# Patient Record
Sex: Male | Born: 1950
Health system: Southern US, Community
[De-identification: ages and names within clinical notes are randomized; demographics above are authoritative.]

## PROBLEM LIST (undated history)

## (undated) DIAGNOSIS — I10 Essential (primary) hypertension: Secondary | ICD-10-CM

## (undated) DIAGNOSIS — L719 Rosacea, unspecified: Secondary | ICD-10-CM

## (undated) HISTORY — DX: Rosacea, unspecified: L71.9

## (undated) HISTORY — DX: Essential (primary) hypertension: I10

---

## 2003-08-20 ENCOUNTER — Ambulatory Visit (HOSPITAL_COMMUNITY): Admission: RE | Admit: 2003-08-20 | Discharge: 2003-08-20 | Payer: Self-pay | Admitting: Gastroenterology

## 2005-03-05 ENCOUNTER — Emergency Department (HOSPITAL_COMMUNITY): Admission: EM | Admit: 2005-03-05 | Discharge: 2005-03-05 | Payer: Self-pay | Admitting: *Deleted
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
## 2018-07-26 DIAGNOSIS — Z85828 Personal history of other malignant neoplasm of skin: Secondary | ICD-10-CM | POA: Diagnosis not present

## 2018-07-26 DIAGNOSIS — L570 Actinic keratosis: Secondary | ICD-10-CM | POA: Diagnosis not present

## 2018-07-26 DIAGNOSIS — Z08 Encounter for follow-up examination after completed treatment for malignant neoplasm: Secondary | ICD-10-CM | POA: Diagnosis not present

## 2018-07-26 DIAGNOSIS — C44311 Basal cell carcinoma of skin of nose: Secondary | ICD-10-CM | POA: Diagnosis not present

## 2018-07-26 DIAGNOSIS — X32XXXD Exposure to sunlight, subsequent encounter: Secondary | ICD-10-CM | POA: Diagnosis not present

## 2018-08-29 DIAGNOSIS — E782 Mixed hyperlipidemia: Secondary | ICD-10-CM | POA: Diagnosis not present

## 2018-08-29 DIAGNOSIS — Z0000 Encounter for general adult medical examination without abnormal findings: Secondary | ICD-10-CM | POA: Diagnosis not present

## 2018-08-29 DIAGNOSIS — I10 Essential (primary) hypertension: Secondary | ICD-10-CM | POA: Diagnosis not present

## 2018-08-29 DIAGNOSIS — Z125 Encounter for screening for malignant neoplasm of prostate: Secondary | ICD-10-CM | POA: Diagnosis not present

## 2018-09-01 DIAGNOSIS — Z23 Encounter for immunization: Secondary | ICD-10-CM | POA: Diagnosis not present

## 2018-09-01 DIAGNOSIS — N529 Male erectile dysfunction, unspecified: Secondary | ICD-10-CM | POA: Diagnosis not present

## 2018-09-01 DIAGNOSIS — E782 Mixed hyperlipidemia: Secondary | ICD-10-CM | POA: Diagnosis not present

## 2018-09-01 DIAGNOSIS — Z0000 Encounter for general adult medical examination without abnormal findings: Secondary | ICD-10-CM | POA: Diagnosis not present

## 2018-09-01 DIAGNOSIS — R8290 Unspecified abnormal findings in urine: Secondary | ICD-10-CM | POA: Diagnosis not present

## 2018-09-01 DIAGNOSIS — N400 Enlarged prostate without lower urinary tract symptoms: Secondary | ICD-10-CM | POA: Diagnosis not present

## 2018-09-01 DIAGNOSIS — L570 Actinic keratosis: Secondary | ICD-10-CM | POA: Diagnosis not present

## 2018-09-01 DIAGNOSIS — I10 Essential (primary) hypertension: Secondary | ICD-10-CM | POA: Diagnosis not present

## 2019-05-16 DIAGNOSIS — K130 Diseases of lips: Secondary | ICD-10-CM | POA: Diagnosis not present

## 2019-05-16 DIAGNOSIS — R600 Localized edema: Secondary | ICD-10-CM | POA: Diagnosis not present

## 2019-06-02 DIAGNOSIS — T783XXA Angioneurotic edema, initial encounter: Secondary | ICD-10-CM | POA: Diagnosis not present

## 2019-07-10 DIAGNOSIS — T7840XA Allergy, unspecified, initial encounter: Secondary | ICD-10-CM | POA: Diagnosis not present

## 2019-07-12 DIAGNOSIS — X32XXXD Exposure to sunlight, subsequent encounter: Secondary | ICD-10-CM | POA: Diagnosis not present

## 2019-07-12 DIAGNOSIS — L570 Actinic keratosis: Secondary | ICD-10-CM | POA: Diagnosis not present

## 2019-07-25 ENCOUNTER — Ambulatory Visit: Payer: Self-pay

## 2019-07-31 ENCOUNTER — Encounter: Payer: Self-pay | Admitting: Allergy and Immunology

## 2019-07-31 ENCOUNTER — Ambulatory Visit: Payer: Medicare HMO | Admitting: Allergy and Immunology

## 2019-07-31 ENCOUNTER — Other Ambulatory Visit: Payer: Self-pay

## 2019-07-31 VITALS — BP 102/60 | HR 61 | Temp 97.9°F | Resp 18 | Ht 65.0 in | Wt 184.8 lb

## 2019-07-31 DIAGNOSIS — T783XXD Angioneurotic edema, subsequent encounter: Secondary | ICD-10-CM

## 2019-07-31 DIAGNOSIS — L500 Allergic urticaria: Secondary | ICD-10-CM | POA: Diagnosis not present

## 2019-07-31 DIAGNOSIS — T7840XA Allergy, unspecified, initial encounter: Secondary | ICD-10-CM | POA: Insufficient documentation

## 2019-07-31 DIAGNOSIS — T783XXA Angioneurotic edema, initial encounter: Secondary | ICD-10-CM | POA: Insufficient documentation

## 2019-07-31 DIAGNOSIS — T7840XD Allergy, unspecified, subsequent encounter: Secondary | ICD-10-CM | POA: Diagnosis not present

## 2019-07-31 MED ORDER — FAMOTIDINE 20 MG PO TABS: 20.0000 mg | ORAL_TABLET | Freq: Two times a day (BID) | ORAL | 5 refills | Status: AC

## 2019-07-31 MED ORDER — LEVOCETIRIZINE DIHYDROCHLORIDE 5 MG PO TABS: 5.0000 mg | ORAL_TABLET | Freq: Every evening | ORAL | 5 refills | Status: DC

## 2019-07-31 NOTE — Progress Notes (Signed)
New Patient Note  RE: Austin James MRN: [id number] DOB: [birth year] Date of Office Visit: 07/31/2019  Referring provider: Leighton Ruff, MD Primary care provider: Hulan Fess, MD  Chief Complaint: Allergic Reaction and Angioedema   History of present illness: Austin James is a 69 y.o. male seen today in consultation requested by Leighton Ruff, MD.  He reports that approximately 10 weeks ago he developed swelling of the palm of his left hand as well as swelling of both feet and a small portion of his lip.  He did not experience concomitant urticaria, cardiopulmonary symptoms, or GI symptoms.  He saw his primary care physician and was advised to start cetirizine.  He was doing a home remodeling project at the time with the use of cement mix, otherwise no specific medication, food, skin care product, detergent, soap, or other environmental suspected triggers were identified.  Approximately 2 weeks later he experienced swelling of the lips and tongue.  He was still working on the house remodel project with the use of concrete.  He was advised to continue cetirizine, was prescribed prednisone, and was advised to have someone else finish the remodeling project.  Despite no longer being in contact with the concrete mix, he has experienced recurrence of swelling approximately every 7 to 8 days.  The most recent occurrence was 2 nights ago.  He has no known family history of angioedema.  He currently takes amlodipine/benazepril and reports that he has been on this medication for more than 10 years at the same dose.  Assessment and plan: Angioedema Unclear etiology.  Food allergen skin testing was negative today despite a positive histamine control.  The patient is currently taking an ACE inhibitor which may be the underlying etiology despite the fact he has been on this medication for many years.  He has not had urticaria nor have there been concomitant symptoms concerning for anaphylaxis.  Will order labs to rule out hereditary angioedema, acquired angioedema, urticaria associated angioedema, and other potential etiologies. For symptom relief, patient is to take oral antihistamines as directed. However, if the underlying pathophysiology is bradykinin mediated, antihistamines will not reduce symptoms.  The following labs have been ordered: Antithyroglobulin antibody, thyroid peroxidase antibody, tryptase, C4, C1 esterase inhibitor (quantitative and functional), C1q, CBC, CMP, and galactose-alpha-1,3-galactose IgE level.  The patient will be notified with further recommendations after lab results have returned.  For now, ACE inhibitors should be avoided.  I will defer to the patient's primary care physician to manage the hypertension with another suitable agent for the time being.  It should be noted that stuttering angioedema may occur for a few months following the discontinuation of the ACE inhibitor.  Instructions have been provided and discussed for H1/H2 receptor blockade with step-wise increase/decrease to find lowest effective dose.  A prescription has been provided for levocetirizine(Xyzal), 5 mg daily as needed.  A prescription has been provided for famotidine (Pepcid), 20 mg twice daily if needed.  Should symptoms recur, a  journal is to be kept recording any foods eaten, beverages consumed, medications taken, activities performed, and environmental conditions within a 6 hour period prior to the onset of symptoms. For any symptoms concerning for anaphylaxis, 911 is to be called immediately.   Meds ordered this encounter  Medications  . levocetirizine (XYZAL) 5 MG tablet    Sig: Take 1 tablet (5 mg total) by mouth every evening.    Dispense:  30 tablet    Refill:  5  . famotidine (PEPCID)  20 MG tablet    Sig: Take 1 tablet (20 mg total) by mouth 2 (two) times daily.    Dispense:  60 tablet    Refill:  5    Diagnostics: Environmental skin testing: Negative despite  a positive histamine control. Food allergen skin testing: Negative despite a positive histamine control.    Physical examination: Blood pressure 102/60, pulse 61, temperature 97.9 F (36.6 C), temperature source Temporal, resp. rate 18, height 5\' 5"  (1.651 m), weight 184 lb 12.8 oz (83.8 kg), SpO2 98 %.  General: Alert, interactive, in no acute distress. HEENT: TMs pearly gray, turbinates mildly edematous without discharge, post-pharynx unremarkable. Neck: Supple without lymphadenopathy. Lungs: Clear to auscultation without wheezing, rhonchi or rales. CV: Normal S1, S2 without murmurs. Abdomen: Nondistended, nontender. Skin: Warm and dry, without lesions or rashes. Extremities:  No clubbing, cyanosis or edema. Neuro:   Grossly intact.  Review of systems:  Review of systems negative except as noted in HPI / PMHx or noted below: Review of Systems  Constitutional: Negative.   HENT: Negative.   Eyes: Negative.   Respiratory: Negative.   Cardiovascular: Negative.   Gastrointestinal: Negative.   Genitourinary: Negative.   Musculoskeletal: Negative.   Skin: Negative.   Neurological: Negative.   Endo/Heme/Allergies: Negative.   Psychiatric/Behavioral: Negative.     Past medical history:  Past Medical History:  Diagnosis Date  . Hypertension   . Rosacea     Past surgical history:  History reviewed. No pertinent surgical history.  Family history: Family History  Family history unknown: Yes    Social history: Social History   Socioeconomic History  . Marital status: Married    Spouse name: Not on file  . Number of children: Not on file  . Years of education: Not on file  . Highest education level: Not on file  Occupational History  . Not on file  Tobacco Use  . Smoking status: Never Smoker  . Smokeless tobacco: Never Used  Substance and Sexual Activity  . Alcohol use: Yes    Comment: social  . Drug use: Never  . Sexual activity: Not on file  Other Topics  Concern  . Not on file  Social History Narrative  . Not on file   Social Determinants of Health   Financial Resource Strain:   . Difficulty of Paying Living Expenses: Not on file  Food Insecurity:   . Worried About Charity fundraiser in the Last Year: Not on file  . Ran Out of Food in the Last Year: Not on file  Transportation Needs:   . Lack of Transportation (Medical): Not on file  . Lack of Transportation (Non-Medical): Not on file  Physical Activity:   . Days of Exercise per Week: Not on file  . Minutes of Exercise per Session: Not on file  Stress:   . Feeling of Stress : Not on file  Social Connections:   . Frequency of Communication with Friends and Family: Not on file  . Frequency of Social Gatherings with Friends and Family: Not on file  . Attends Religious Services: Not on file  . Active Member of Clubs or Organizations: Not on file  . Attends Archivist Meetings: Not on file  . Marital Status: Not on file  Intimate Partner Violence:   . Fear of Current or Ex-Partner: Not on file  . Emotionally Abused: Not on file  . Physically Abused: Not on file  . Sexually Abused: Not on file  Environmental History: The patient lives in a house built in 1988 with carpeting throughout and [place] system.  There is no known mold/water damage in the home.  There are 2 cats and 1 dog in the home which have access to his bedroom.  He is a non-smoker.   Current Outpatient Medications  Medication Sig Dispense Refill  . amLODipine-benazepril (LOTREL) 10-40 MG capsule     . amLODipine-benazepril (LOTREL) 10-40 MG capsule     . atorvastatin (LIPITOR) 10 MG tablet     . Azelaic Acid (FINACEA) 15 % cream Apply 1 application topically 2 (two) times daily as needed. After skin is thoroughly washed and patted dry, gently but thoroughly massage a thin film of azelaic acid cream into the affected area twice daily, in the morning and evening.    . cetirizine (ZYRTEC) 10 MG  tablet Take 10 mg by mouth as directed.    Marland Kitchen EPINEPHrine 0.3 mg/0.3 mL IJ SOAJ injection     . hydrochlorothiazide (HYDRODIURIL) 25 MG tablet     . predniSONE (DELTASONE) 20 MG tablet     . famotidine (PEPCID) 20 MG tablet Take 1 tablet (20 mg total) by mouth 2 (two) times daily. 60 tablet 5  . levocetirizine (XYZAL) 5 MG tablet Take 1 tablet (5 mg total) by mouth every evening. 30 tablet 5   No current facility-administered medications for this visit.    Known medication allergies: No Known Allergies  I appreciate the opportunity to take part in Adithya's care. Please do not hesitate to contact me with questions.  Sincerely,   R. Edgar Frisk, MD

## 2019-07-31 NOTE — Patient Instructions (Signed)
Angioedema Unclear etiology.  Food allergen skin testing was negative today despite a positive histamine control.  The patient is currently taking an ACE inhibitor which may be the underlying etiology despite the fact he has been on this medication for many years.  He has not had urticaria nor have there been concomitant symptoms concerning for anaphylaxis. Will order labs to rule out hereditary angioedema, acquired angioedema, urticaria associated angioedema, and other potential etiologies. For symptom relief, patient is to take oral antihistamines as directed. However, if the underlying pathophysiology is bradykinin mediated, antihistamines will not reduce symptoms.  The following labs have been ordered: Antithyroglobulin antibody, thyroid peroxidase antibody, tryptase, C4, C1 esterase inhibitor (quantitative and functional), C1q, CBC, CMP, and galactose-alpha-1,3-galactose IgE level.  The patient will be notified with further recommendations after lab results have returned.  For now, ACE inhibitors should be avoided.  I will defer to the patient's primary care physician to manage the hypertension with another suitable agent for the time being.  It should be noted that stuttering angioedema may occur for a few months following the discontinuation of the ACE inhibitor.  Instructions have been provided and discussed for H1/H2 receptor blockade with step-wise increase/decrease to find lowest effective dose.  A prescription has been provided for levocetirizine(Xyzal), 5 mg daily as needed.  A prescription has been provided for famotidine (Pepcid), 20 mg twice daily if needed.  Should symptoms recur, a  journal is to be kept recording any foods eaten, beverages consumed, medications taken, activities performed, and environmental conditions within a 6 hour period prior to the onset of symptoms. For any symptoms concerning for anaphylaxis, 911 is to be called immediately.   When lab results have  returned you will be called with further recommendations. With the newly implemented Cures Act, the labs may be visible to you at the same time they become visible to [place]. However, the results will typically not be addressed until all of the results are back, so please be patient.  Until you have heard from [place], please continue the treatment plan as outlined on your take home sheet.  Urticaria (Hives)  . Levocetirizine (Xyzal) 5 mg twice a day and famotidine (Pepcid) 20 mg twice a day. If no symptoms for 7-14 days then decrease to. . Levocetirizine (Xyzal) 5 mg twice a day and famotidine (Pepcid) 20 mg once a day.  If no symptoms for 7-14 days then decrease to. . Levocetirizine (Xyzal) 5 mg twice a day.  If no symptoms for 7-14 days then decrease to. . Levocetirizine (Xyzal) 5 mg once a day.  May use Benadryl (diphenhydramine) as needed for breakthrough symptoms       If symptoms return, then step up dosage

## 2019-07-31 NOTE — Assessment & Plan Note (Addendum)
Unclear etiology.  Food allergen skin testing was negative today despite a positive histamine control.  The patient is currently taking an ACE inhibitor which may be the underlying etiology despite the fact he has been on this medication for many years.  He has not had urticaria nor have there been concomitant symptoms concerning for anaphylaxis. Will order labs to rule out hereditary angioedema, acquired angioedema, urticaria associated angioedema, and other potential etiologies. For symptom relief, patient is to take oral antihistamines as directed. However, if the underlying pathophysiology is bradykinin mediated, antihistamines will not reduce symptoms.  The following labs have been ordered: Antithyroglobulin antibody, thyroid peroxidase antibody, tryptase, C4, C1 esterase inhibitor (quantitative and functional), C1q, CBC, CMP, and galactose-alpha-1,3-galactose IgE level.  The patient will be notified with further recommendations after lab results have returned.  For now, ACE inhibitors should be avoided.  I will defer to the patient's primary care physician to manage the hypertension with another suitable agent for the time being.  It should be noted that stuttering angioedema may occur for a few months following the discontinuation of the ACE inhibitor.  Instructions have been provided and discussed for H1/H2 receptor blockade with step-wise increase/decrease to find lowest effective dose.  A prescription has been provided for levocetirizine(Xyzal), 5 mg daily as needed.  A prescription has been provided for famotidine (Pepcid), 20 mg twice daily if needed.  Should symptoms recur, a  journal is to be kept recording any foods eaten, beverages consumed, medications taken, activities performed, and environmental conditions within a 6 hour period prior to the onset of symptoms. For any symptoms concerning for anaphylaxis, 911 is to be called immediately.

## 2019-08-03 ENCOUNTER — Ambulatory Visit: Payer: Medicare HMO | Attending: Internal Medicine

## 2019-08-03 DIAGNOSIS — Z23 Encounter for immunization: Secondary | ICD-10-CM | POA: Insufficient documentation

## 2019-08-03 NOTE — Progress Notes (Signed)
   Covid-19 Vaccination Clinic  Name:  Austin James    MRN: [id number] DOB: [birth year]  08/03/2019  [name] was observed post Covid-19 immunization for 15 minutes without incidence. He was provided with Vaccine Information Sheet and instruction to access the V-Safe system.   [name] was instructed to call 911 with any severe reactions post vaccine: Marland Kitchen Difficulty breathing  . Swelling of your face and throat  . A fast heartbeat  . A bad rash all over your body  . Dizziness and weakness    Immunizations Administered    Name Date Dose VIS Date Route   Pfizer COVID-19 Vaccine 08/03/2019 12:01 PM 0.3 mL 06/09/2019 Intramuscular   Manufacturer: [place]   Lot: [id number]   [place]: [id number]

## 2019-08-15 ENCOUNTER — Ambulatory Visit: Payer: Self-pay

## 2019-08-29 ENCOUNTER — Ambulatory Visit: Payer: Medicare HMO | Attending: Internal Medicine

## 2019-08-29 DIAGNOSIS — Z23 Encounter for immunization: Secondary | ICD-10-CM | POA: Insufficient documentation

## 2019-08-29 NOTE — Progress Notes (Signed)
   Covid-19 Vaccination Clinic  Name:  WILLIES OZAKI    MRN: [id number] DOB: 04/16/1951  08/29/2019  Mr. Lehrke was observed post Covid-19 immunization for 15 minutes without incident. He was provided with Vaccine Information Sheet and instruction to access the V-Safe system.   Mr. Tierrablanca was instructed to call 911 with any severe reactions post vaccine: Marland Kitchen Difficulty breathing  . Swelling of face and throat  . A fast heartbeat  . A bad rash all over body  . Dizziness and weakness   Immunizations Administered    Name Date Dose VIS Date Route   Pfizer COVID-19 Vaccine 08/29/2019  8:59 AM 0.3 mL 06/09/2019 Intramuscular   Manufacturer: [employer]   Lot: [id number]   [place]: [id number]

## 2019-09-28 DIAGNOSIS — N400 Enlarged prostate without lower urinary tract symptoms: Secondary | ICD-10-CM | POA: Diagnosis not present

## 2019-09-28 DIAGNOSIS — H6121 Impacted cerumen, right ear: Secondary | ICD-10-CM | POA: Diagnosis not present

## 2019-09-28 DIAGNOSIS — Z0000 Encounter for general adult medical examination without abnormal findings: Secondary | ICD-10-CM | POA: Diagnosis not present

## 2019-09-28 DIAGNOSIS — N529 Male erectile dysfunction, unspecified: Secondary | ICD-10-CM | POA: Diagnosis not present

## 2019-09-28 DIAGNOSIS — E782 Mixed hyperlipidemia: Secondary | ICD-10-CM | POA: Diagnosis not present

## 2019-09-28 DIAGNOSIS — Z125 Encounter for screening for malignant neoplasm of prostate: Secondary | ICD-10-CM | POA: Diagnosis not present

## 2019-09-28 DIAGNOSIS — Z1159 Encounter for screening for other viral diseases: Secondary | ICD-10-CM | POA: Diagnosis not present

## 2019-09-28 DIAGNOSIS — I10 Essential (primary) hypertension: Secondary | ICD-10-CM | POA: Diagnosis not present

## 2019-09-28 DIAGNOSIS — L570 Actinic keratosis: Secondary | ICD-10-CM | POA: Diagnosis not present

## 2020-01-05 DIAGNOSIS — D72819 Decreased white blood cell count, unspecified: Secondary | ICD-10-CM | POA: Diagnosis not present

## 2020-01-05 DIAGNOSIS — Z125 Encounter for screening for malignant neoplasm of prostate: Secondary | ICD-10-CM | POA: Diagnosis not present

## 2020-01-18 ENCOUNTER — Telehealth: Payer: Self-pay

## 2020-01-18 MED ORDER — LEVOCETIRIZINE DIHYDROCHLORIDE 5 MG PO TABS: 5.0000 mg | ORAL_TABLET | Freq: Every evening | ORAL | 5 refills | Status: DC

## 2020-01-18 NOTE — Telephone Encounter (Signed)
Received fax requesting a refill for Levocetirizine. Patient was last seen 07/31/2019. Refill has been sent in.

## 2020-01-23 MED ORDER — LEVOCETIRIZINE DIHYDROCHLORIDE 5 MG PO TABS: 5.0000 mg | ORAL_TABLET | Freq: Every evening | ORAL | 5 refills | Status: AC

## 2020-01-23 NOTE — Addendum Note (Signed)
Addended by: Herbie Drape on: 01/23/2020 11:58 AM   Modules accepted: Orders

## 2020-03-14 ENCOUNTER — Other Ambulatory Visit: Payer: Self-pay

## 2020-03-14 ENCOUNTER — Ambulatory Visit: Payer: Medicare HMO | Admitting: Allergy and Immunology

## 2020-03-14 ENCOUNTER — Encounter: Payer: Self-pay | Admitting: Allergy and Immunology

## 2020-03-14 VITALS — BP 120/70 | HR 52 | Temp 98.1°F | Resp 16 | Ht 64.5 in | Wt 182.2 lb

## 2020-03-14 DIAGNOSIS — T7840XD Allergy, unspecified, subsequent encounter: Secondary | ICD-10-CM | POA: Diagnosis not present

## 2020-03-14 DIAGNOSIS — L500 Allergic urticaria: Secondary | ICD-10-CM | POA: Diagnosis not present

## 2020-03-14 DIAGNOSIS — T783XXD Angioneurotic edema, subsequent encounter: Secondary | ICD-10-CM

## 2020-03-14 NOTE — Patient Instructions (Addendum)
Angioedema Unclear etiology.    The following labs have been ordered: Antithyroglobulin antibody, thyroid peroxidase antibody, tryptase, C4, C1 esterase inhibitor (quantitative and functional), C1q, CBC, CMP, and galactose-alpha-1,3-galactose IgE level.  The patient will be notified with further recommendations after lab results have returned.  Continue to avoid ACE inhibitors.  Continue levocetirizine(Xyzal), 5 mg daily as needed.  If needed, add famotidine (Pepcid), 20 mg twice daily if needed.  Should significant symptoms occur, a  journal is to be kept recording any foods eaten, beverages consumed, medications taken, activities performed, and environmental conditions within a 6 hour period prior to the onset of symptoms. For any symptoms concerning for anaphylaxis, epinephrine is to be administered and 911 is to be called immediately.   When lab results have returned you will be called with further recommendations. With the newly implemented Cures Act, the labs may be visible to you at the same time they become visible to [place]. However, the results will typically not be addressed until all of the results are back, so please be patient.  Until you have heard from [place], please continue the treatment plan as outlined on your take home sheet.

## 2020-03-14 NOTE — Assessment & Plan Note (Signed)
Unclear etiology.    The following labs have been ordered: Antithyroglobulin antibody, thyroid peroxidase antibody, tryptase, C4, C1 esterase inhibitor (quantitative and functional), C1q, CBC, CMP, and galactose-alpha-1,3-galactose IgE level.  The patient will be notified with further recommendations after lab results have returned.  Continue to avoid ACE inhibitors.  Continue levocetirizine(Xyzal), 5 mg daily as needed.  If needed, add famotidine (Pepcid), 20 mg twice daily if needed.  Should significant symptoms occur, a  journal is to be kept recording any foods eaten, beverages consumed, medications taken, activities performed, and environmental conditions within a 6 hour period prior to the onset of symptoms. For any symptoms concerning for anaphylaxis, epinephrine is to be administered and 911 is to be called immediately.

## 2020-03-14 NOTE — Progress Notes (Signed)
Follow-up Note  RE: Austin James MRN: [id number] DOB: [birth year] Date of Office Visit: 03/14/2020  Primary care provider: Hulan Fess, MD Referring provider: Hulan Fess, MD  History of present illness: Austin James is a 69 y.o. male with a history of angioedema, and possible urticaria, returning today for follow-up.  He was previously seen in this clinic for his initial evaluation in February 2021.  During his last visit it was recommended that he discontinue the use of the ACE inhibitor.  He has discontinued the ACE inhibitor, however approximately once per month on average he develops mild swelling of one of his feet, the inside of his cheek, and/your tongue.  He reports that the symptoms invariably occur in the middle of the night.  Labs were ordered on his previous visit, however these labs were not drawn.  He denies concomitant urticaria, cardiopulmonary symptoms, and/or GI symptoms when he experiences the angioedema.  He has access to epinephrine autoinjector 2 pack.  Assessment and plan: Angioedema Unclear etiology.    The following labs have been ordered: Antithyroglobulin antibody, thyroid peroxidase antibody, tryptase, C4, C1 esterase inhibitor (quantitative and functional), C1q, CBC, CMP, and galactose-alpha-1,3-galactose IgE level.  The patient will be notified with further recommendations after lab results have returned.  Continue to avoid ACE inhibitors.  Continue levocetirizine(Xyzal), 5 mg daily as needed.  If needed, add famotidine (Pepcid), 20 mg twice daily if needed.  Should significant symptoms occur, a  journal is to be kept recording any foods eaten, beverages consumed, medications taken, activities performed, and environmental conditions within a 6 hour period prior to the onset of symptoms. For any symptoms concerning for anaphylaxis, epinephrine is to be administered and 911 is to be called immediately.   Physical examination: Blood pressure  120/70, pulse (!) 52, temperature 98.1 F (36.7 C), temperature source Oral, resp. rate 16, height 5' 4.5" (1.638 m), weight 182 lb 3.2 oz (82.6 kg), SpO2 99 %.  General: Alert, interactive, in no acute distress. HEENT: Buccal mucosa, tongue, and post-pharynx are unremarkable. Neck: Supple without lymphadenopathy. Lungs: Clear to auscultation without wheezing, rhonchi or rales. CV: Normal S1, S2 without murmurs. Skin: Warm and dry, without lesions or rashes.  The following portions of the patient's history were reviewed and updated as appropriate: allergies, current medications, past family history, past medical history, past social history, past surgical history and problem list.  Current Outpatient Medications  Medication Sig Dispense Refill  . amLODipine-benazepril (LOTREL) 10-40 MG capsule     . atorvastatin (LIPITOR) 10 MG tablet     . Azelaic Acid (FINACEA) 15 % cream Apply 1 application topically 2 (two) times daily as needed. After skin is thoroughly washed and patted dry, gently but thoroughly massage a thin film of azelaic acid cream into the affected area twice daily, in the morning and evening.    . hydrochlorothiazide (HYDRODIURIL) 25 MG tablet     . levocetirizine (XYZAL) 5 MG tablet Take 1 tablet (5 mg total) by mouth every evening. 30 tablet 5  . cetirizine (ZYRTEC) 10 MG tablet Take 10 mg by mouth as directed. (Patient not taking: Reported on 03/14/2020)    . EPINEPHrine 0.3 mg/0.3 mL IJ SOAJ injection     . famotidine (PEPCID) 20 MG tablet Take 1 tablet (20 mg total) by mouth 2 (two) times daily. (Patient not taking: Reported on 03/14/2020) 60 tablet 5  . predniSONE (DELTASONE) 20 MG tablet  (Patient not taking: Reported on 03/14/2020)     No  current facility-administered medications for this visit.    No Known Allergies  I appreciate the opportunity to take part in Austin James's care. Please do not hesitate to contact me with questions.  Sincerely,   R. Edgar Frisk,  MD

## 2020-03-20 LAB — CBC WITH DIFFERENTIAL/PLATELET
Basophils Absolute: 0 10*3/uL (ref 0.0–0.2)
Basos: 1 %
EOS (ABSOLUTE): 0.1 10*3/uL (ref 0.0–0.4)
Eos: 2 %
Hematocrit: 44 % (ref 37.5–51.0)
Hemoglobin: 15.3 g/dL (ref 13.0–17.7)
Immature Grans (Abs): 0 10*3/uL (ref 0.0–0.1)
Immature Granulocytes: 0 %
Lymphocytes Absolute: 1.3 10*3/uL (ref 0.7–3.1)
Lymphs: 30 %
MCH: 32.3 pg (ref 26.6–33.0)
MCHC: 34.8 g/dL (ref 31.5–35.7)
MCV: 93 fL (ref 79–97)
Monocytes Absolute: 0.4 10*3/uL (ref 0.1–0.9)
Monocytes: 8 %
Neutrophils Absolute: 2.6 10*3/uL (ref 1.4–7.0)
Neutrophils: 59 %
Platelets: 254 10*3/uL (ref 150–450)
RBC: 4.74 x10E6/uL (ref 4.14–5.80)
RDW: 12.8 % (ref 11.6–15.4)
WBC: 4.5 10*3/uL (ref 3.4–10.8)

## 2020-03-20 LAB — ALPHA-GAL PANEL
Alpha Gal IgE*: <0.1 kU/L (ref <0.10)
Beef (Bos spp) IgE: <0.1 kU/L (ref <0.35)
Class Interpretation: 0
Class Interpretation: 0
Class Interpretation: 0
Lamb/Mutton (Ovis spp) IgE: <0.1 kU/L (ref <0.35)
Pork (Sus spp) IgE: <0.1 kU/L (ref <0.35)

## 2020-03-20 LAB — TRYPTASE: Tryptase: 11.8 ug/L (ref 2.2–13.2)

## 2020-03-20 LAB — COMPREHENSIVE METABOLIC PANEL
ALT: 24 IU/L (ref 0–44)
AST: 25 IU/L (ref 0–40)
Albumin/Globulin Ratio: 1.6 (ref 1.2–2.2)
Albumin: 4.3 g/dL (ref 3.8–4.8)
Alkaline Phosphatase: 104 IU/L (ref 44–121)
BUN/Creatinine Ratio: 19 (ref 10–24)
BUN: 19 mg/dL (ref 8–27)
Bilirubin Total: 0.6 mg/dL (ref 0.0–1.2)
CO2: 27 mmol/L (ref 20–29)
Calcium: 9.3 mg/dL (ref 8.6–10.2)
Chloride: 99 mmol/L (ref 96–106)
Creatinine, Ser: 0.98 mg/dL (ref 0.76–1.27)
GFR calc Af Amer: 92 mL/min/{1.73_m2} (ref >59)
GFR calc non Af Amer: 79 mL/min/{1.73_m2} (ref >59)
Globulin, Total: 2.7 g/dL (ref 1.5–4.5)
Glucose: 112 mg/dL — ABNORMAL HIGH (ref 65–99)
Potassium: 3.9 mmol/L (ref 3.5–5.2)
Sodium: 139 mmol/L (ref 134–144)
Total Protein: 7 g/dL (ref 6.0–8.5)

## 2020-03-20 LAB — THYROID PEROXIDASE ANTIBODY: Thyroperoxidase Ab SerPl-aCnc: <8 IU/mL (ref 0–34)

## 2020-03-20 LAB — C4 COMPLEMENT: Complement C4, Serum: 22 mg/dL (ref 12–38)

## 2020-03-20 LAB — COMPLEMENT COMPONENT C1Q: Complement C1Q: 14 mg/dL (ref 10.2–20.3)

## 2020-03-20 LAB — C1 ESTERASE INHIBITOR, FUNCTIONAL: C1INH Functional/C1INH Total MFr SerPl: 76 %mean normal

## 2020-03-20 LAB — THYROGLOBULIN ANTIBODY: Thyroglobulin Antibody: <1 IU/mL (ref 0.0–0.9)

## 2020-04-11 DIAGNOSIS — M79671 Pain in right foot: Secondary | ICD-10-CM | POA: Diagnosis not present

## 2020-04-29 ENCOUNTER — Encounter: Payer: Self-pay | Admitting: Podiatrist

## 2020-04-29 ENCOUNTER — Other Ambulatory Visit: Payer: Self-pay

## 2020-04-29 ENCOUNTER — Ambulatory Visit: Payer: Medicare HMO | Admitting: Podiatrist

## 2020-04-29 ENCOUNTER — Other Ambulatory Visit: Payer: Self-pay | Admitting: Podiatrist

## 2020-04-29 ENCOUNTER — Ambulatory Visit (INDEPENDENT_AMBULATORY_CARE_PROVIDER_SITE_OTHER): Payer: Medicare HMO

## 2020-04-29 DIAGNOSIS — M722 Plantar fascial fibromatosis: Secondary | ICD-10-CM

## 2020-04-29 DIAGNOSIS — M79671 Pain in right foot: Secondary | ICD-10-CM | POA: Diagnosis not present

## 2020-04-29 NOTE — Patient Instructions (Signed)
Voltaren Gel is available over the counter and is an antiinflammatory gel you can massage into your heel a couple times a day.   Plantar Fasciitis (Heel Spur Syndrome) with Rehab The plantar fascia is a fibrous, ligament-like, soft-tissue structure that spans the bottom of the foot. Plantar fasciitis is a condition that causes pain in the foot due to inflammation of the tissue. SYMPTOMS   Pain and tenderness on the underneath side of the foot.  Pain that worsens with standing or walking. CAUSES  Plantar fasciitis is caused by irritation and injury to the plantar fascia on the underneath side of the foot. Common mechanisms of injury include:  Direct trauma to bottom of the foot.  Damage to a small nerve that runs under the foot where the main fascia attaches to the heel bone. Stress placed on the plantar fascia due to any mild increased activity or injury RISK INCREASES WITH:   Obesity.  Poor strength and flexibility.  Improperly fitted shoes.  Tight calf muscles.  Flat feet.  Failure to warm-up properly before activity.  PREVENTION  Warm up and stretch properly before activity.  Strength, flexibility  Maintain a health body weight.  Avoid stress on the plantar fascia.  Wear properly fitted shoes, including arch supports for individuals who have flat feet. PROGNOSIS  If treated properly, then the symptoms of plantar fasciitis usually resolve without surgery. However, occasionally surgery is necessary. RELATED COMPLICATIONS   Recurrent symptoms that may result in a chronic condition.  Problems of the lower back that are caused by compensating for the injury, such as limping.  Pain or weakness of the foot during push-off following surgery.  Chronic inflammation, scarring, and partial or complete fascia tear, occurring more often from repeated injections. TREATMENT  Treatment initially involves the use of ice and medication to help reduce pain and inflammation. The  use of strengthening and stretching exercises may help reduce pain with activity, especially stretches of the Achilles tendon.  Your caregiver may recommend that you use arch supports to help reduce stress on the plantar fascia. Often, corticosteroid injections are given to reduce inflammation. If symptoms persist for greater than 6 months despite non-surgical (conservative), then surgery may be recommended.  MEDICATION   If pain medication is necessary, then nonsteroidal anti-inflammatory medications, such as aspirin and ibuprofen, or other minor pain relievers, such as acetaminophen, are often recommended. Corticosteroid injections may be given by your caregiver.  HEAT AND COLD  Cold treatment (icing) relieves pain and reduces inflammation. Cold treatment should be applied for 10 to 15 minutes every 2 to 3 hours for inflammation and pain and immediately after any activity that aggravates your symptoms. Use ice packs or massage the area with a piece of ice (ice massage).  Heat treatment may be used prior to performing the stretching and strengthening activities prescribed by your caregiver, physical therapist, or athletic trainer. Use a heat pack or soak the injury in warm water. SEEK IMMEDIATE MEDICAL CARE IF:  Treatment seems to offer no benefit, or the condition worsens.  Any medications produce adverse side effects.  Perform this particular stretch daily first thing in the morning and before you go to bed. Hold for 30 seconds.    Try all the exercises and choose your favorite 3 to perform daily--  EXERCISES-- perform each exercise a total of 10-15 repetitions.  Hold for 30 seconds and perform 3 times per day   RANGE OF MOTION (ROM) AND STRETCHING EXERCISES - Plantar Fasciitis (Heel Spur Syndrome)  These exercises may help you when beginning to rehabilitate your injury.   While completing these exercises, remember:   Restoring tissue flexibility helps normal motion to return to the  joints. This allows healthier, less painful movement and activity.  An effective stretch should be held for at least 30 seconds.  A stretch should never be painful. You should only feel a gentle lengthening or release in the stretched tissue. RANGE OF MOTION - Toe Extension, Flexion  Sit with your right / left leg crossed over your opposite knee.  Grasp your toes and gently pull them back toward the top of your foot. You should feel a stretch on the bottom of your toes and/or foot.  Hold this stretch for __________ seconds.  Now, gently pull your toes toward the bottom of your foot. You should feel a stretch on the top of your toes and or foot.  Hold this stretch for __________ seconds. Repeat __________ times. Complete this stretch __________ times per day.  RANGE OF MOTION - Ankle Dorsiflexion, Active Assisted  Remove shoes and sit on a chair that is preferably not on a carpeted surface.  Place right / left foot under knee. Extend your opposite leg for support.  Keeping your heel down, slide your right / left foot back toward the chair until you feel a stretch at your ankle or calf. If you do not feel a stretch, slide your bottom forward to the edge of the chair, while still keeping your heel down.  Hold this stretch for __________ seconds. Repeat __________ times. Complete this stretch __________ times per day.  STRETCH  Gastroc, Standing  Place hands on wall.  Extend right / left leg, keeping the front knee somewhat bent.  Slightly point your toes inward on your back foot.  Keeping your right / left heel on the floor and your knee straight, shift your weight toward the wall, not allowing your back to arch.  You should feel a gentle stretch in the right / left calf. Hold this position for __________ seconds. Repeat __________ times. Complete this stretch __________ times per day. STRETCH  Soleus, Standing  Place hands on wall.  Extend right / left leg, keeping the other  knee somewhat bent.  Slightly point your toes inward on your back foot.  Keep your right / left heel on the floor, bend your back knee, and slightly shift your weight over the back leg so that you feel a gentle stretch deep in your back calf.  Hold this position for __________ seconds. Repeat __________ times. Complete this stretch __________ times per day. STRETCH  Gastrocsoleus, Standing  Note: This exercise can place a lot of stress on your foot and ankle. Please complete this exercise only if specifically instructed by your caregiver.   Place the ball of your right / left foot on a step, keeping your other foot firmly on the same step.  Hold on to the wall or a rail for balance.  Slowly lift your other foot, allowing your body weight to press your heel down over the edge of the step.  You should feel a stretch in your right / left calf.  Hold this position for __________ seconds.  Repeat this exercise with a slight bend in your right / left knee. Repeat __________ times. Complete this stretch __________ times per day.  STRENGTHENING EXERCISES - Plantar Fasciitis (Heel Spur Syndrome)  These exercises may help you when beginning to rehabilitate your injury. They may resolve your symptoms with  or without further involvement from your physician, physical therapist or athletic trainer. While completing these exercises, remember:   Muscles can gain both the endurance and the strength needed for everyday activities through controlled exercises.  Complete these exercises as instructed by your physician, physical therapist or athletic trainer. Progress the resistance and repetitions only as guided.

## 2020-04-29 NOTE — Progress Notes (Signed)
Subjective: Austin James is a 69 y.o. male patient presents to office with complaint of moderate heel pain on the right. Patient admits to post static dyskinesia for 9 months on and off .  He noticed the pain started after he began playing pickleball.  He states he stopped for 3 months and the pain subsided.  Then when he began playing again, the pain returned.  He has pain with first step in the morning or after sitting for long periods.  Denies any other pedal complaints.   Patient Active Problem List   Diagnosis Date Noted  . Allergic reaction 07/31/2019  . Angioedema 07/31/2019  . Allergic urticaria 07/31/2019    Current Outpatient Medications on File Prior to Visit  Medication Sig Dispense Refill  . amLODipine (NORVASC) 10 MG tablet     . amLODipine-benazepril (LOTREL) 10-40 MG capsule     . atorvastatin (LIPITOR) 10 MG tablet     . Azelaic Acid (FINACEA) 15 % cream Apply 1 application topically 2 (two) times daily as needed. After skin is thoroughly washed and patted dry, gently but thoroughly massage a thin film of azelaic acid cream into the affected area twice daily, in the morning and evening.    Marland Kitchen EPINEPHrine 0.3 mg/0.3 mL IJ SOAJ injection     . famotidine (PEPCID) 20 MG tablet Take 1 tablet (20 mg total) by mouth 2 (two) times daily. (Patient not taking: Reported on 03/14/2020) 60 tablet 5  . hydrochlorothiazide (HYDRODIURIL) 25 MG tablet     . levocetirizine (XYZAL) 5 MG tablet Take 1 tablet (5 mg total) by mouth every evening. 30 tablet 5   No current facility-administered medications on file prior to visit.    No Known Allergies  Objective: Physical Exam General: The patient is alert and oriented x3 in no acute distress.  Dermatology: Skin is warm, dry and supple bilateral lower extremities. Nails 1-10 are normal. There is no erythema, edema, no eccymosis, no open lesions present. Integument is otherwise unremarkable.  Vascular: Dorsalis Pedis pulse and Posterior  Tibial pulse are 2/4 bilateral. Capillary fill time is immediate to all digits.  Neurological: Grossly intact to light touch with an achilles reflex of +2/5 and a  negative Tinel's sign bilateral.  Musculoskeletal: Tenderness to palpation at the medial calcaneal tubercale and through to the medial and lateral  insertion of the plantar fascia on the right foot. No pain with compression of calcaneus bilateral. No pain with calf compression bilateral. Pes plauns foot type is noted.  Strength 5/5 in all groups bilateral.   Gait: Unassisted, Antalgic   Xray, Right foot  Normal osseous mineralization. Joint spaces preserved. No fracture/dislocation/boney destruction. Calcaneal spur present with mild thickening of plantar fascia. No other soft tissue abnormalities or radiopaque foreign bodies.   Assessment and Plan:    ICD-10-CM   1. Plantar fascial fibromatosis  M72.2   2. Foot pain, right  M79.671 DG Foot Complete Right    -Complete examination performed.  -Xrays reviewed -Discussed with patient in detail the condition of plantar fasciitis, how this occurs and general treatment options. Explained both conservative and surgical treatments.  -Recommended good supportive shoes and advised use of OTC (powerstep) insert. Explained to patient that if these orthoses work well, we will continue with these. If these do not improve his  condition and  pain, we will consider custom molded orthoses. - dispensed to patient daily stretching exercises. -Patient will call in 3-4 weeks if condition has not improved to consider  custom orthotics or a possible injection.

## 2020-07-10 DIAGNOSIS — L570 Actinic keratosis: Secondary | ICD-10-CM | POA: Diagnosis not present

## 2020-07-10 DIAGNOSIS — X32XXXD Exposure to sunlight, subsequent encounter: Secondary | ICD-10-CM | POA: Diagnosis not present

## 2020-08-15 ENCOUNTER — Telehealth: Payer: Self-pay | Admitting: Podiatrist

## 2020-08-15 NOTE — Telephone Encounter (Signed)
Pt called and left message stating he got some orthotics when he was here in November and is looking to replace them and needed more information,   I returned call after reviewing chart and pt got the powersteps(OTC) orthotics I explained that they are 55.00 plus tax and he can just stop in and pick them up. I verified his size and that we had them in stock.

## 2020-12-06 DIAGNOSIS — E782 Mixed hyperlipidemia: Secondary | ICD-10-CM | POA: Diagnosis not present

## 2020-12-06 DIAGNOSIS — I10 Essential (primary) hypertension: Secondary | ICD-10-CM | POA: Diagnosis not present

## 2020-12-06 DIAGNOSIS — Z125 Encounter for screening for malignant neoplasm of prostate: Secondary | ICD-10-CM | POA: Diagnosis not present

## 2020-12-06 DIAGNOSIS — T783XXS Angioneurotic edema, sequela: Secondary | ICD-10-CM | POA: Diagnosis not present

## 2020-12-06 DIAGNOSIS — R899 Unspecified abnormal finding in specimens from other organs, systems and tissues: Secondary | ICD-10-CM | POA: Diagnosis not present

## 2020-12-06 DIAGNOSIS — N400 Enlarged prostate without lower urinary tract symptoms: Secondary | ICD-10-CM | POA: Diagnosis not present

## 2021-03-27 DIAGNOSIS — Z23 Encounter for immunization: Secondary | ICD-10-CM | POA: Diagnosis not present

## 2021-03-27 DIAGNOSIS — R972 Elevated prostate specific antigen [PSA]: Secondary | ICD-10-CM | POA: Diagnosis not present

## 2021-03-31 DIAGNOSIS — X32XXXD Exposure to sunlight, subsequent encounter: Secondary | ICD-10-CM | POA: Diagnosis not present

## 2021-03-31 DIAGNOSIS — L570 Actinic keratosis: Secondary | ICD-10-CM | POA: Diagnosis not present

## 2021-04-01 DIAGNOSIS — H2513 Age-related nuclear cataract, bilateral: Secondary | ICD-10-CM | POA: Diagnosis not present

## 2021-04-01 DIAGNOSIS — H524 Presbyopia: Secondary | ICD-10-CM | POA: Diagnosis not present

## 2021-04-21 DIAGNOSIS — J069 Acute upper respiratory infection, unspecified: Secondary | ICD-10-CM | POA: Diagnosis not present

## 2021-04-21 DIAGNOSIS — H109 Unspecified conjunctivitis: Secondary | ICD-10-CM | POA: Diagnosis not present

## 2021-07-09 DIAGNOSIS — E782 Mixed hyperlipidemia: Secondary | ICD-10-CM | POA: Diagnosis not present

## 2021-07-09 DIAGNOSIS — Z0000 Encounter for general adult medical examination without abnormal findings: Secondary | ICD-10-CM | POA: Diagnosis not present

## 2021-07-09 DIAGNOSIS — I10 Essential (primary) hypertension: Secondary | ICD-10-CM | POA: Diagnosis not present

## 2021-07-09 DIAGNOSIS — E559 Vitamin D deficiency, unspecified: Secondary | ICD-10-CM | POA: Diagnosis not present

## 2021-07-09 DIAGNOSIS — N400 Enlarged prostate without lower urinary tract symptoms: Secondary | ICD-10-CM | POA: Diagnosis not present

## 2021-10-28 DIAGNOSIS — L570 Actinic keratosis: Secondary | ICD-10-CM | POA: Diagnosis not present

## 2021-10-28 DIAGNOSIS — X32XXXD Exposure to sunlight, subsequent encounter: Secondary | ICD-10-CM | POA: Diagnosis not present

## 2022-03-03 DIAGNOSIS — M25552 Pain in left hip: Secondary | ICD-10-CM | POA: Diagnosis not present

## 2022-04-06 DIAGNOSIS — M25552 Pain in left hip: Secondary | ICD-10-CM | POA: Diagnosis not present

## 2022-07-23 DIAGNOSIS — Z0000 Encounter for general adult medical examination without abnormal findings: Secondary | ICD-10-CM | POA: Diagnosis not present

## 2022-07-23 DIAGNOSIS — E782 Mixed hyperlipidemia: Secondary | ICD-10-CM | POA: Diagnosis not present

## 2022-07-23 DIAGNOSIS — E559 Vitamin D deficiency, unspecified: Secondary | ICD-10-CM | POA: Diagnosis not present

## 2022-07-23 DIAGNOSIS — I10 Essential (primary) hypertension: Secondary | ICD-10-CM | POA: Diagnosis not present

## 2022-07-23 DIAGNOSIS — N400 Enlarged prostate without lower urinary tract symptoms: Secondary | ICD-10-CM | POA: Diagnosis not present

## 2022-12-07 DIAGNOSIS — Z0000 Encounter for general adult medical examination without abnormal findings: Secondary | ICD-10-CM | POA: Diagnosis not present

## 2022-12-07 DIAGNOSIS — M25561 Pain in right knee: Secondary | ICD-10-CM | POA: Diagnosis not present

## 2022-12-07 DIAGNOSIS — M25551 Pain in right hip: Secondary | ICD-10-CM | POA: Diagnosis not present

## 2023-01-07 DIAGNOSIS — M79676 Pain in unspecified toe(s): Secondary | ICD-10-CM

## 2023-01-14 DIAGNOSIS — M25551 Pain in right hip: Secondary | ICD-10-CM | POA: Diagnosis not present

## 2023-02-08 DIAGNOSIS — M7061 Trochanteric bursitis, right hip: Secondary | ICD-10-CM | POA: Diagnosis not present

## 2023-03-12 DIAGNOSIS — M25551 Pain in right hip: Secondary | ICD-10-CM | POA: Diagnosis not present

## 2023-03-18 DIAGNOSIS — H2513 Age-related nuclear cataract, bilateral: Secondary | ICD-10-CM | POA: Diagnosis not present

## 2023-03-18 DIAGNOSIS — H43812 Vitreous degeneration, left eye: Secondary | ICD-10-CM | POA: Diagnosis not present

## 2023-03-25 DIAGNOSIS — M1611 Unilateral primary osteoarthritis, right hip: Secondary | ICD-10-CM | POA: Diagnosis not present

## 2023-03-30 DIAGNOSIS — M1611 Unilateral primary osteoarthritis, right hip: Secondary | ICD-10-CM | POA: Diagnosis not present

## 2023-03-31 DIAGNOSIS — Z23 Encounter for immunization: Secondary | ICD-10-CM | POA: Diagnosis not present

## 2023-03-31 DIAGNOSIS — R7309 Other abnormal glucose: Secondary | ICD-10-CM | POA: Diagnosis not present

## 2023-03-31 DIAGNOSIS — M25551 Pain in right hip: Secondary | ICD-10-CM | POA: Diagnosis not present

## 2023-03-31 DIAGNOSIS — Z01818 Encounter for other preprocedural examination: Secondary | ICD-10-CM | POA: Diagnosis not present

## 2023-03-31 DIAGNOSIS — I491 Atrial premature depolarization: Secondary | ICD-10-CM | POA: Diagnosis not present

## 2023-03-31 DIAGNOSIS — I10 Essential (primary) hypertension: Secondary | ICD-10-CM | POA: Diagnosis not present

## 2023-04-02 ENCOUNTER — Telehealth: Payer: Self-pay | Admitting: Cardiology

## 2023-04-02 NOTE — Telephone Encounter (Signed)
Patient has not been seen by cardiologist in our practice. I did not see a cardiologist listed in Care Everywhere. Dr.Marchwiany's office will need to have the patient make an appointment to be established with a cardiologist accepting new patients before pre-operative clearance can be completed.   Thanks, Joni Reining DNP, ANP. AACC

## 2023-04-02 NOTE — Telephone Encounter (Signed)
   Pre-operative Risk Assessment    Patient Name: Austin James  DOB: [birth year] MRN: [id number]  Last visit New patient Next visit 05-24-23      Request for Surgical Clearance    Procedure:   right total hip arthroplasty   Date of Surgery:  Clearance TBD                                 Surgeon:  Dr. Weber Cooks Surgeon's Group or Practice Name:  [facility] Phone number:  [phone] for surgery schedular, Bonney Leitz Fax number:  [phone]   Type of Clearance Requested:   - Medical    Type of Anesthesia:  Spinal   Additional requests/questions:   office would like for [place] to send recent lab work  Signed, Royann Shivers   04/02/2023, 11:22 AM

## 2023-04-02 NOTE — Telephone Encounter (Signed)
Pt has new pt appt with Dr. Odis Hollingshead 05/24/23. I will update all parties involved.

## 2023-04-02 NOTE — Telephone Encounter (Signed)
Thanks for the update.   Ezell Poke [place], DO, [facility]

## 2023-04-07 ENCOUNTER — Ambulatory Visit: Payer: Medicare HMO

## 2023-04-07 VITALS — BP 136/74 | HR 58 | Ht 64.5 in | Wt 178.6 lb

## 2023-04-07 DIAGNOSIS — I10 Essential (primary) hypertension: Secondary | ICD-10-CM | POA: Diagnosis not present

## 2023-04-07 DIAGNOSIS — Z01810 Encounter for preprocedural cardiovascular examination: Secondary | ICD-10-CM | POA: Insufficient documentation

## 2023-04-07 DIAGNOSIS — I491 Atrial premature depolarization: Secondary | ICD-10-CM | POA: Insufficient documentation

## 2023-04-07 NOTE — Assessment & Plan Note (Addendum)
Asymptomatic, identified on EKG. He does have risk factors in the form of snoring, no prior workup for sleep apnea.  Discussed benign nature of these.  No obvious trigger at this time.  Would recommend evaluation for sleep apnea. Would defer this to PCP.  Advise cutting back on stimulants such as caffeine intake.  Advised to keep himself well-hydrated.  Reviewed potential for higher incidence of other forms of arrhythmia such as atrial fibrillation and atrial flutter, in individuals with frequent atrial ectopic beats.

## 2023-04-07 NOTE — Assessment & Plan Note (Signed)
Optimally controlled at this time on amlodipine and hydrochlorothiazide. Continue with his current medications.  Has longstanding history of hypertension, will obtain a transthoracic echocardiogram to assess for any features of hypertensive heart disease including diastolic function and atrial size.  Target blood pressure readings below 130 over 80 mmHg.

## 2023-04-07 NOTE — Patient Instructions (Addendum)
Medication Instructions:  Your physician recommends that you continue on your current medications as directed. Please refer to the Current Medication list given to you today.  *If you need a refill on your cardiac medications before your next appointment, please call your pharmacy*   Lab Work: None ordered   If you have labs (blood work) drawn today and your tests are completely normal, you will receive your results only by: MyChart Message (if you have MyChart) OR A paper copy in the mail If you have any lab test that is abnormal or we need to change your treatment, we will call you to review the results.   Testing/Procedures: Your physician has requested that you have an echocardiogram. Echocardiography is a painless test that uses sound waves to create images of your heart. It provides your doctor with information about the size and shape of your heart and how well your heart's chambers and valves are working. This procedure takes approximately one hour. There are no restrictions for this procedure. Please do NOT wear cologne, perfume, aftershave, or lotions (deodorant is allowed). Please arrive 15 minutes prior to your appointment time.    Follow-Up: You can follow up with our office as needed   Other Instructions We will send your preoperative clearance over to [facility]

## 2023-04-07 NOTE — Progress Notes (Signed)
Cardiology Consultation:    Date:  04/07/2023   ID:  Austin James, DOB 09-Feb-1951, MRN [id number]  PCP:  Austin Gosselin, MD  Cardiologist:  Austin Corporal Levana Minetti, MD   Referring MD: Austin Has, MD   No chief complaint on file. Preop cardiovascular risk assessment prior to elective right total hip arthroplasty, scheduled with Austin James, tentatively being planned under spinal anesthesia   ASSESSMENT AND PLAN:   Austin James 72 year old with history of hypertension, dyslipidemia, erectile dysfunction, diverticulosis, frequent atrial ectopy on EKG at PCPs office being considered for elective right hip arthroplasty under spinal anesthesia seen today for preop cardiovascular risk assessment.   Problem List Items Addressed This Visit     Preoperative cardiovascular examination - Primary    From preoperative cardiovascular risk assessment standpoint, he James excellent functional status as recently as end of May playing 5 days a week of pickleball without any cardiac symptoms.  Symptoms physical activity more or less limited now due to his hip pain.  No significant cardiovascular risk factors.  From cardiac standpoint okay to proceed with his elective hip surgery as being planned, he remains at low risk for perioperative cardiac complications.  We are in the process of obtaining it echocardiogram for cardiac assessment in the setting of his longstanding hypertension history.  This should not affect the timeline of his surgery.      Relevant Orders   EKG 12-Lead (Completed)   Essential hypertension    Optimally controlled at this time on amlodipine and hydrochlorothiazide. Continue with his current medications.  James longstanding history of hypertension, will obtain a transthoracic echocardiogram to assess for any features of hypertensive heart disease including diastolic function and atrial size.  Target blood pressure readings below 130 over 80 mmHg.       Relevant Orders    ECHOCARDIOGRAM COMPLETE   Atrial premature beats, contractions, or systoles    Asymptomatic, identified on EKG. He does have risk factors in the form of snoring, no prior workup for sleep apnea.  Discussed benign nature of these.  No obvious trigger at this time.  Would recommend evaluation for sleep apnea. Would defer this to PCP.  Advise cutting back on stimulants such as caffeine intake.  Advised to keep himself well-hydrated.  Reviewed potential for higher incidence of other forms of arrhythmia such as atrial fibrillation and atrial flutter, in individuals with frequent atrial ectopic beats.      Return to clinic on an as-needed basis. Will update any additional recommendations based on echocardiogram findings   History of Present Illness:    Austin James is a 72 y.o. male who is being seen today for the evaluation of preoperative cardiovascular risk assessment prior to elective right total hip arthroplasty tentatively being planned under spinal anesthesia with Austin James at the request of Austin Has, MD.  James history of hypertension, dyslipidemia, erectile dysfunction, diverticulosis, frequent atrial ectopy noted on EKG at PCPs office. Occasional alcohol consumption, up to 2-3 diet Coke drinks  a day, non-smoker.  No recreational drug use. No prior history of CAD, CHF, MI, CVA James family history of atrial fibrillation in his siblings  In otherwise good health and was playing pickle ball 5 days a week until end of May, started having issues with his right hip pain not controlled with conservative methods and currently being evaluated for hip arthroplasty.  Denies any cardiac symptoms.  Not aware of any palpitations or skipped beat sensations.  Denies any near syncopal or syncopal episodes.  No orthopnea and paroxysmal nocturnal dyspnea.  No pedal edema.  No significant weight changes.  Good control of blood pressure, currently on amlodipine and  hydrochlorothiazide. Atorvastatin 10 mg once daily, tolerating well.  EKG in the clinic today shows sinus rhythm with heart rate 58/min, frequent atrial ectopic beats observed.  QRS axis leftward's, duration 90 ms.  In comparison EKG from PCPs office 03-31-2023 noted sinus rhythm heart rate 50/min with frequent atrial ectopic beats.  Blood work from 03-31-2023 sodium 140, potassium 3.4, BUN 21, creatinine 0.96, EGFR 85 Hemoglobin 15.2, hematocrit 44.1, WBC 4.2, platelets 228 TSH 1.06, normal Last lipid panel July 23, 2022 total cholesterol 154, HDL 54, LDL 81, triglycerides 95   Past Medical History:  Diagnosis Date   Hypertension    Rosacea     History reviewed. No pertinent surgical history.  Current Medications: Current Meds  Medication Sig   amLODipine (NORVASC) 10 MG tablet Take 10 mg by mouth daily.   atorvastatin (LIPITOR) 10 MG tablet Take 10 mg by mouth daily.   Azelaic Acid (FINACEA) 15 % cream Apply 1 application topically 2 (two) times daily as needed. After skin is thoroughly washed and patted dry, gently but thoroughly massage a thin film of azelaic acid cream into the affected area twice daily, in the morning and evening.   EPINEPHrine 0.3 mg/0.3 mL IJ SOAJ injection    hydrochlorothiazide (HYDRODIURIL) 25 MG tablet Take 25 mg by mouth daily.   levocetirizine (XYZAL) 5 MG tablet Take 1 tablet (5 mg total) by mouth every evening.     Allergies:   Patient James no known allergies.   Social History   Socioeconomic History   Marital status: Married    Spouse name: Not on file   Number of children: Not on file   Years of education: Not on file   Highest education level: Not on file  Occupational History   Not on file  Tobacco Use   Smoking status: Never   Smokeless tobacco: Never  Vaping Use   Vaping status: Never Used  Substance and Sexual Activity   Alcohol use: Yes    Comment: social   Drug use: Never   Sexual activity: Not on file  Other Topics Concern    Not on file  Social History Narrative   Not on file   Social Determinants of Health   Financial Resource Strain: Not on file  Food Insecurity: Not on file  Transportation Needs: Not on file  Physical Activity: Not on file  Stress: Not on file  Social Connections: Not on file     Family History: The patient's Family history is unknown by patient. ROS:   Please see the history of present illness.    All 14 point review of systems negative except as described per history of present illness.  EKGs/Labs/Other Studies Reviewed:    The following studies were reviewed today:   EKG:  EKG Interpretation Date/Time:  Wednesday April 07 2023 13:47:16 EDT Ventricular Rate:  58 PR Interval:  150 QRS Duration:  90 QT Interval:  408 QTC Calculation: 400 R Axis:   -34  Text Interpretation: Sinus bradycardia with Blocked Premature atrial complexes Left axis deviation Nonspecific ST abnormality Abnormal ECG No previous ECGs available Confirmed by Huntley Dec reddy [phone]) on 04/07/2023 2:25:56 PM    Recent Labs: No results found for requested labs within last 365 days.  Recent Lipid Panel No results found for: "CHOL", "TRIG", "HDL", "CHOLHDL", "VLDL", "LDLCALC", "LDLDIRECT"  Physical Exam:  VS:  BP 136/74   Pulse (!) 58   Ht 5' 4.5" (1.638 m)   Wt 178 lb 9.6 oz (81 kg)   SpO2 97%   BMI 30.18 kg/m     Wt Readings from Last 3 Encounters:  04/07/23 178 lb 9.6 oz (81 kg)  03/14/20 182 lb 3.2 oz (82.6 kg)  07/31/19 184 lb 12.8 oz (83.8 kg)     GENERAL:  Well nourished, well developed in no acute distress NECK: No JVD; No carotid bruits CARDIAC: RRR, S1 and S2 present, no murmurs, no rubs, no gallops CHEST:  Clear to auscultation without rales, wheezing or rhonchi  Extremities: No pitting pedal edema. Pulses bilaterally symmetric with radial 2+ and dorsalis pedis 2+ NEUROLOGIC:  Alert and oriented x 3  Medication Adjustments/Labs and Tests Ordered: Current  medicines are reviewed at length with the patient today.  Concerns regarding medicines are outlined above.  Orders Placed This Encounter  Procedures   EKG 12-Lead   ECHOCARDIOGRAM COMPLETE   No orders of the defined types were placed in this encounter.   Signed, Eagan Shifflett reddy Mandy Peeks, MD, MPH, [facility]. 04/07/2023 2:32 PM    [facility]

## 2023-04-07 NOTE — Assessment & Plan Note (Signed)
From preoperative cardiovascular risk assessment standpoint, he has excellent functional status as recently as end of May playing 5 days a week of pickleball without any cardiac symptoms.  Symptoms physical activity more or less limited now due to his hip pain.  No significant cardiovascular risk factors.  From cardiac standpoint okay to proceed with his elective hip surgery as being planned, he remains at low risk for perioperative cardiac complications.  We are in the process of obtaining it echocardiogram for cardiac assessment in the setting of his longstanding hypertension history.  This should not affect the timeline of his surgery.

## 2023-04-15 DIAGNOSIS — M1611 Unilateral primary osteoarthritis, right hip: Secondary | ICD-10-CM | POA: Diagnosis not present

## 2023-04-15 DIAGNOSIS — M25551 Pain in right hip: Secondary | ICD-10-CM | POA: Diagnosis not present

## 2023-04-28 ENCOUNTER — Ambulatory Visit (HOSPITAL_COMMUNITY): Payer: Medicare HMO

## 2023-04-28 DIAGNOSIS — I10 Essential (primary) hypertension: Secondary | ICD-10-CM | POA: Diagnosis not present

## 2023-04-29 LAB — ECHOCARDIOGRAM COMPLETE: S' Lateral: 2.8 cm

## 2023-05-07 DIAGNOSIS — M1611 Unilateral primary osteoarthritis, right hip: Secondary | ICD-10-CM | POA: Diagnosis not present

## 2023-05-10 DIAGNOSIS — M1611 Unilateral primary osteoarthritis, right hip: Secondary | ICD-10-CM | POA: Diagnosis not present

## 2023-05-12 DIAGNOSIS — Z96641 Presence of right artificial hip joint: Secondary | ICD-10-CM | POA: Diagnosis not present

## 2023-05-12 DIAGNOSIS — R339 Retention of urine, unspecified: Secondary | ICD-10-CM | POA: Diagnosis not present

## 2023-05-12 DIAGNOSIS — I499 Cardiac arrhythmia, unspecified: Secondary | ICD-10-CM | POA: Diagnosis not present

## 2023-05-13 DIAGNOSIS — I340 Nonrheumatic mitral (valve) insufficiency: Secondary | ICD-10-CM | POA: Insufficient documentation

## 2023-05-13 DIAGNOSIS — I071 Rheumatic tricuspid insufficiency: Secondary | ICD-10-CM | POA: Insufficient documentation

## 2023-05-13 DIAGNOSIS — I272 Pulmonary hypertension, unspecified: Secondary | ICD-10-CM | POA: Insufficient documentation

## 2023-05-18 DIAGNOSIS — M1611 Unilateral primary osteoarthritis, right hip: Secondary | ICD-10-CM | POA: Diagnosis not present

## 2023-05-20 DIAGNOSIS — M1611 Unilateral primary osteoarthritis, right hip: Secondary | ICD-10-CM | POA: Diagnosis not present

## 2023-05-24 ENCOUNTER — Ambulatory Visit: Payer: Medicare HMO | Admitting: Cardiology

## 2023-05-24 DIAGNOSIS — R3914 Feeling of incomplete bladder emptying: Secondary | ICD-10-CM | POA: Diagnosis not present

## 2023-05-24 DIAGNOSIS — N401 Enlarged prostate with lower urinary tract symptoms: Secondary | ICD-10-CM | POA: Diagnosis not present

## 2023-05-24 DIAGNOSIS — R351 Nocturia: Secondary | ICD-10-CM | POA: Diagnosis not present

## 2023-05-25 DIAGNOSIS — M1611 Unilateral primary osteoarthritis, right hip: Secondary | ICD-10-CM | POA: Diagnosis not present

## 2023-06-01 DIAGNOSIS — M1611 Unilateral primary osteoarthritis, right hip: Secondary | ICD-10-CM | POA: Diagnosis not present

## 2023-06-08 ENCOUNTER — Telehealth: Payer: Self-pay

## 2023-06-08 DIAGNOSIS — I071 Rheumatic tricuspid insufficiency: Secondary | ICD-10-CM

## 2023-06-08 DIAGNOSIS — I340 Nonrheumatic mitral (valve) insufficiency: Secondary | ICD-10-CM

## 2023-06-08 DIAGNOSIS — I272 Pulmonary hypertension, unspecified: Secondary | ICD-10-CM

## 2023-06-08 NOTE — Telephone Encounter (Signed)
-----   Message from [place] R Madireddy sent at 05/13/2023 11:31 AM EST ----- Please schedule for 1 year follow-up visit tentatively around October next year. Will also need a repeat transthoracic echocardiogram to be done prior to the visit next October to assess mitral and tricuspid regurgitation and mild pulmonary hypertension.

## 2023-06-09 DIAGNOSIS — X32XXXD Exposure to sunlight, subsequent encounter: Secondary | ICD-10-CM | POA: Diagnosis not present

## 2023-06-09 DIAGNOSIS — L570 Actinic keratosis: Secondary | ICD-10-CM | POA: Diagnosis not present

## 2023-06-09 DIAGNOSIS — D225 Melanocytic nevi of trunk: Secondary | ICD-10-CM | POA: Diagnosis not present

## 2023-06-09 DIAGNOSIS — L821 Other seborrheic keratosis: Secondary | ICD-10-CM | POA: Diagnosis not present

## 2023-06-09 DIAGNOSIS — M1611 Unilateral primary osteoarthritis, right hip: Secondary | ICD-10-CM | POA: Diagnosis not present

## 2023-06-15 DIAGNOSIS — M1611 Unilateral primary osteoarthritis, right hip: Secondary | ICD-10-CM | POA: Diagnosis not present

## 2023-06-17 DIAGNOSIS — M1611 Unilateral primary osteoarthritis, right hip: Secondary | ICD-10-CM | POA: Diagnosis not present

## 2023-08-03 DIAGNOSIS — E782 Mixed hyperlipidemia: Secondary | ICD-10-CM | POA: Diagnosis not present

## 2023-08-03 DIAGNOSIS — I10 Essential (primary) hypertension: Secondary | ICD-10-CM | POA: Diagnosis not present

## 2023-08-03 DIAGNOSIS — N400 Enlarged prostate without lower urinary tract symptoms: Secondary | ICD-10-CM | POA: Diagnosis not present

## 2023-08-03 DIAGNOSIS — R7303 Prediabetes: Secondary | ICD-10-CM | POA: Diagnosis not present

## 2023-08-03 DIAGNOSIS — Z0000 Encounter for general adult medical examination without abnormal findings: Secondary | ICD-10-CM | POA: Diagnosis not present

## 2023-08-03 DIAGNOSIS — E559 Vitamin D deficiency, unspecified: Secondary | ICD-10-CM | POA: Diagnosis not present

## 2023-08-06 DIAGNOSIS — Z23 Encounter for immunization: Secondary | ICD-10-CM | POA: Diagnosis not present

## 2023-08-06 DIAGNOSIS — N400 Enlarged prostate without lower urinary tract symptoms: Secondary | ICD-10-CM | POA: Diagnosis not present

## 2023-08-06 DIAGNOSIS — E782 Mixed hyperlipidemia: Secondary | ICD-10-CM | POA: Diagnosis not present

## 2023-08-06 DIAGNOSIS — Z0000 Encounter for general adult medical examination without abnormal findings: Secondary | ICD-10-CM | POA: Diagnosis not present

## 2023-08-06 DIAGNOSIS — I10 Essential (primary) hypertension: Secondary | ICD-10-CM | POA: Diagnosis not present

## 2023-08-06 DIAGNOSIS — E559 Vitamin D deficiency, unspecified: Secondary | ICD-10-CM | POA: Diagnosis not present

## 2023-09-20 DIAGNOSIS — R351 Nocturia: Secondary | ICD-10-CM | POA: Diagnosis not present

## 2023-09-20 DIAGNOSIS — N401 Enlarged prostate with lower urinary tract symptoms: Secondary | ICD-10-CM | POA: Diagnosis not present

## 2023-09-20 DIAGNOSIS — R3914 Feeling of incomplete bladder emptying: Secondary | ICD-10-CM | POA: Diagnosis not present

## 2024-01-20 DIAGNOSIS — M25512 Pain in left shoulder: Secondary | ICD-10-CM | POA: Diagnosis not present

## 2024-01-20 DIAGNOSIS — M25561 Pain in right knee: Secondary | ICD-10-CM | POA: Diagnosis not present

## 2024-02-10 DIAGNOSIS — I491 Atrial premature depolarization: Secondary | ICD-10-CM | POA: Diagnosis not present

## 2024-02-10 DIAGNOSIS — Z1211 Encounter for screening for malignant neoplasm of colon: Secondary | ICD-10-CM | POA: Diagnosis not present

## 2024-02-17 DIAGNOSIS — M25512 Pain in left shoulder: Secondary | ICD-10-CM | POA: Diagnosis not present

## 2024-02-17 DIAGNOSIS — M7661 Achilles tendinitis, right leg: Secondary | ICD-10-CM | POA: Diagnosis not present

## 2024-02-25 DIAGNOSIS — K5730 Diverticulosis of large intestine without perforation or abscess without bleeding: Secondary | ICD-10-CM | POA: Diagnosis not present

## 2024-02-25 DIAGNOSIS — Z1211 Encounter for screening for malignant neoplasm of colon: Secondary | ICD-10-CM | POA: Diagnosis not present

## 2024-03-08 DIAGNOSIS — M25512 Pain in left shoulder: Secondary | ICD-10-CM | POA: Diagnosis not present

## 2024-03-28 DIAGNOSIS — M25512 Pain in left shoulder: Secondary | ICD-10-CM | POA: Diagnosis not present

## 2024-03-30 ENCOUNTER — Ambulatory Visit (HOSPITAL_COMMUNITY)
Admission: RE | Admit: 2024-03-30 | Discharge: 2024-03-30 | Disposition: A | Discharge status: Home / Self Care | Source: Ambulatory Visit | Attending: Cardiology | Admitting: Cardiology

## 2024-03-30 DIAGNOSIS — I340 Nonrheumatic mitral (valve) insufficiency: Secondary | ICD-10-CM | POA: Insufficient documentation

## 2024-03-30 DIAGNOSIS — I071 Rheumatic tricuspid insufficiency: Secondary | ICD-10-CM | POA: Insufficient documentation

## 2024-03-30 DIAGNOSIS — I272 Pulmonary hypertension, unspecified: Secondary | ICD-10-CM | POA: Diagnosis not present

## 2024-03-30 LAB — ECHOCARDIOGRAM COMPLETE
Area-P 1/2: 2.27 cm2
S' Lateral: 2.7 cm

## 2024-04-18 ENCOUNTER — Ambulatory Visit

## 2024-04-24 ENCOUNTER — Telehealth: Payer: Self-pay

## 2024-04-24 NOTE — Telephone Encounter (Signed)
 Patient would like to switch from Dr. Liborio to Dr. Floretta do to office location. Are you both in agreement?

## 2024-04-25 ENCOUNTER — Ambulatory Visit: Payer: Self-pay

## 2024-04-26 NOTE — Telephone Encounter (Signed)
 LVM to schedule with Dr. Floretta provider switch was approved.

## 2024-04-27 ENCOUNTER — Telehealth: Payer: Self-pay | Admitting: Student in an Organized Health Care Education/Training Program

## 2024-04-27 NOTE — Telephone Encounter (Signed)
 Patient calling in to see if he needs to make an appt for his echo results. Please advise

## 2024-04-27 NOTE — Telephone Encounter (Signed)
 Spoke with pt who is requesting to schedule appointment with Dr Georganna Archer as he has requested to transfer his care from Dr Madireddy.  Appointment scheduled for 05/04/2024 at 10am.  Pt verbalizes understanding and agrees with current plan.

## 2024-05-03 DIAGNOSIS — E782 Mixed hyperlipidemia: Secondary | ICD-10-CM | POA: Insufficient documentation

## 2024-05-03 NOTE — Progress Notes (Incomplete)
 Cardiology Office Note:   Date:  05/03/2024  ID:  RUDI BUNYARD, DOB [birth year], MRN [id number] PCP: Morgan Drivers, MD  [facility] Providers Cardiologist:  None { Chief Complaint: No chief complaint on file.     History of Present Illness:   Austin James is a 73 y.o. male with a PMH of HTN, HLD, diverticulosis who presents for follow up.  Initially establish care with Dr. Liborio last year for preoperative risk assessment.  He underwent an echocardiogram which showed moderately elevated RVSP and mild MR/TR.  Repeat echocardiogram on 10/2 showed improvement in RVSP with stability of the regurgitant lesions.  He presents today for follow-up.   Past Medical History:  Diagnosis Date   Hypertension    Rosacea      Studies Reviewed:    EKG: ***       Cardiac Studies & Procedures   ______________________________________________________________________________________________     ECHOCARDIOGRAM  ECHOCARDIOGRAM COMPLETE 03/30/2024  Narrative ECHOCARDIOGRAM REPORT    Patient Name:   MARGARITO DEHAAS Date of Exam: 03/30/2024 Medical Rec #:  [id number]       Height:       64.5 in Accession #:    [id number]      Weight:       178.6 lb Date of Birth:  Dec 30, 1950      BSA:          1.874 m Patient Age:    72 years        BP:           123/73 mmHg Patient Gender: M               HR:           54 bpm. Exam Location:  [address]  Procedure: 2D Echo, 3D Echo, Color Doppler and Cardiac Doppler (Both Spectral and Color Flow Doppler were utilized during procedure).  Indications:    I27.2 Pulmonary Hypertension  History:        Patient has prior history of Echocardiogram examinations, most recent 04/28/2023. Mitral regurgitation, Tricupsid regurgitation; Risk Factors:Hypertension.  Sonographer:    Waldo Guadalajara RCS Referring Phys: [id number] SREEDHAR R MADIREDDY  IMPRESSIONS   1. Left ventricular ejection fraction, by estimation, is 70 to 75%. Left  ventricular ejection fraction by 3D volume is 71 %. The left ventricle has hyperdynamic function. The left ventricle has no regional wall motion abnormalities. Left ventricular diastolic parameters were normal. 2. Right ventricular systolic function is normal. The right ventricular size is normal. There is normal pulmonary artery systolic pressure. The estimated right ventricular systolic pressure is 28.6 mmHg. 3. Left atrial size was mildly dilated. 4. The mitral valve is degenerative. Trivial mitral valve regurgitation. No evidence of mitral stenosis. Moderate mitral annular calcification. 5. The aortic valve is tricuspid. Aortic valve regurgitation is not visualized. Aortic valve sclerosis is present, with no evidence of aortic valve stenosis. 6. The inferior vena cava is normal in size with greater than 50% respiratory variability, suggesting right atrial pressure of 3 mmHg.  FINDINGS Left Ventricle: Left ventricular ejection fraction, by estimation, is 70 to 75%. Left ventricular ejection fraction by 3D volume is 71 %. The left ventricle has hyperdynamic function. The left ventricle has no regional wall motion abnormalities. The left ventricular internal cavity size was normal in size. There is no left ventricular hypertrophy. Left ventricular diastolic parameters were normal.  Right Ventricle: The right ventricular size is normal. No increase in right ventricular wall thickness. Right  ventricular systolic function is normal. There is normal pulmonary artery systolic pressure. The tricuspid regurgitant velocity is 2.53 m/s, and with an assumed right atrial pressure of 3 mmHg, the estimated right ventricular systolic pressure is 28.6 mmHg.  Left Atrium: Left atrial size was mildly dilated.  Right Atrium: Right atrial size was normal in size.  Pericardium: There is no evidence of pericardial effusion.  Mitral Valve: The mitral valve is degenerative in appearance. Moderate mitral annular  calcification. Trivial mitral valve regurgitation. No evidence of mitral valve stenosis.  Tricuspid Valve: The tricuspid valve is normal in structure. Tricuspid valve regurgitation is mild.  Aortic Valve: The aortic valve is tricuspid. Aortic valve regurgitation is not visualized. Aortic valve sclerosis is present, with no evidence of aortic valve stenosis.  Pulmonic Valve: The pulmonic valve was not well visualized. Pulmonic valve regurgitation is trivial.  Aorta: The aortic root and ascending aorta are structurally normal, with no evidence of dilitation.  Venous: The inferior vena cava is normal in size with greater than 50% respiratory variability, suggesting right atrial pressure of 3 mmHg.  IAS/Shunts: The interatrial septum was not well visualized.  Additional Comments: 3D was performed not requiring image post processing on an independent workstation and was normal.   LEFT VENTRICLE PLAX 2D LVIDd:         3.80 cm         Diastology LVIDs:         2.70 cm         LV e' medial:    7.51 cm/s LV PW:         0.80 cm         LV E/e' medial:  9.2 LV IVS:        0.90 cm         LV e' lateral:   9.03 cm/s LVOT diam:     2.00 cm         LV E/e' lateral: 7.7 LV SV:         85 LV SV Index:   45 LVOT Area:     3.14 cm        3D Volume EF LV IVRT:       155 msec        LV 3D EF:    Left ventricul ar ejection fraction by 3D volume is 71 %.  3D Volume EF: 3D EF:        71 % LV EDV:       103 ml LV ESV:       30 ml LV SV:        73 ml  RIGHT VENTRICLE RV Basal diam:  3.80 cm     PULMONARY VEINS RV S prime:     13.60 cm/s  A Reversal Velocity: 14.10 cm/s TAPSE (M-mode): 2.2 cm      Diastolic Velocity:  42.00 cm/s RVSP:           28.6 mmHg   S/D Velocity:        1.40 Systolic Velocity:   60.70 cm/s  LEFT ATRIUM             Index        RIGHT ATRIUM           Index LA diam:        4.10 cm 2.19 cm/m   RA Pressure: 3.00 mmHg LA Vol (A2C):   78.4 ml 41.83 ml/m  RA Area:      18.70  cm LA Vol (A4C):   66.5 ml 35.48 ml/m  RA Volume:   53.80 ml  28.70 ml/m LA Biplane Vol: 73.3 ml 39.10 ml/m AORTIC VALVE LVOT Vmax:   118.00 cm/s LVOT Vmean:  85.800 cm/s LVOT VTI:    0.269 m  AORTA Ao Root diam: 3.20 cm Ao Asc diam:  3.30 cm  MITRAL VALVE               TRICUSPID VALVE MV Area (PHT):             TR Peak grad:   25.6 mmHg MV Decel Time:             TR Vmax:        253.00 cm/s MV E velocity: 69.20 cm/s  Estimated RAP:  3.00 mmHg MV A velocity: 62.40 cm/s  RVSP:           28.6 mmHg MV E/A ratio:  1.11 SHUNTS Systemic VTI:  0.27 m Systemic Diam: 2.00 cm  Lonni Nanas MD Electronically signed by Lonni Nanas MD Signature Date/Time: 03/30/2024/4:32:13 PM    Final          ______________________________________________________________________________________________      Risk Assessment/Calculations:   {Does this patient have ATRIAL FIBRILLATION?:[id number]} No BP recorded.  {Refresh Note OR Click here to enter BP  :1}***        Physical Exam:     VS:  There were no vitals taken for this visit. ***    Wt Readings from Last 3 Encounters:  04/07/23 178 lb 9.6 oz (81 kg)  03/14/20 182 lb 3.2 oz (82.6 kg)  07/31/19 184 lb 12.8 oz (83.8 kg)     GEN: Well nourished, well developed, in no acute distress NECK: No JVD; No carotid bruits CARDIAC: ***RRR, no murmurs, rubs, gallops RESPIRATORY:  Clear to auscultation without rales, wheezing or rhonchi  ABDOMEN: Soft, non-tender, non-distended, normal bowel sounds EXTREMITIES:  Warm and well perfused, no edema; No deformity, 2+ radial pulses PSYCH: Normal mood and affect   Assessment & Plan Primary hypertension - Follow-up as needed Mixed hyperlipidemia - Lipid panel      {Are you ordering a CV Procedure (e.g. stress test, cath, DCCV, TEE, etc)?   Press F2        :[id number]}   This note was written with the assistance of a dictation microphone or AI dictation software.  Please excuse any typos or grammatical errors.   Signed, Georganna Archer, MD 05/03/2024 11:25 AM    [facility]

## 2024-05-03 NOTE — Assessment & Plan Note (Signed)
-   Follow-up as needed

## 2024-05-03 NOTE — Assessment & Plan Note (Signed)
 Lipid panel

## 2024-05-04 ENCOUNTER — Encounter: Payer: Self-pay | Admitting: Student in an Organized Health Care Education/Training Program

## 2024-05-04 ENCOUNTER — Ambulatory Visit
Attending: Student in an Organized Health Care Education/Training Program | Admitting: Student in an Organized Health Care Education/Training Program

## 2024-05-04 VITALS — BP 110/62 | HR 59 | Ht 65.0 in | Wt 180.8 lb

## 2024-05-04 DIAGNOSIS — E782 Mixed hyperlipidemia: Secondary | ICD-10-CM

## 2024-05-04 DIAGNOSIS — I10 Essential (primary) hypertension: Secondary | ICD-10-CM | POA: Diagnosis not present

## 2024-05-04 DIAGNOSIS — R011 Cardiac murmur, unspecified: Secondary | ICD-10-CM | POA: Diagnosis not present

## 2024-05-04 NOTE — Patient Instructions (Signed)
 Medication Instructions:  Your physician recommends that you continue on your current medications as directed. Please refer to the Current Medication list given to you today.  *If you need a refill on your cardiac medications before your next appointment, please call your pharmacy*  Lab Work: NONE If you have labs (blood work) drawn today and your tests are completely normal, you will receive your results only by: MyChart Message (if you have MyChart) OR A paper copy in the mail If you have any lab test that is abnormal or we need to change your treatment, we will call you to review the results.  Testing/Procedures: NONE  Follow-Up: At [facility], you and your health needs are our priority.  As part of our continuing mission to provide you with exceptional heart care, our providers are all part of one team.  This team includes your primary Cardiologist (physician) and Advanced Practice Providers or APPs (Physician Assistants and Nurse Practitioners) who all work together to provide you with the care you need, when you need it.  Your next appointment:   1 year(s)  Provider:   Dr Floretta
